# Patient Record
Sex: Female | Born: 1985 | Race: White | Hispanic: No | Marital: Single | Smoking: Never smoker
Health system: Southern US, Community
[De-identification: ages and names within clinical notes are randomized; demographics above are authoritative.]

---

## 2008-07-05 ENCOUNTER — Inpatient Hospital Stay: Payer: Self-pay | Admitting: Obstetrics & Gynecology

## 2010-01-24 IMAGING — CR DG CHEST 1V PORT
1 series · 1 of 1 positions shown · non-contrast
Comparison: none

REASON FOR EXAM: Postpartum Tachycardia
COMMENTS:

[view not recorded]
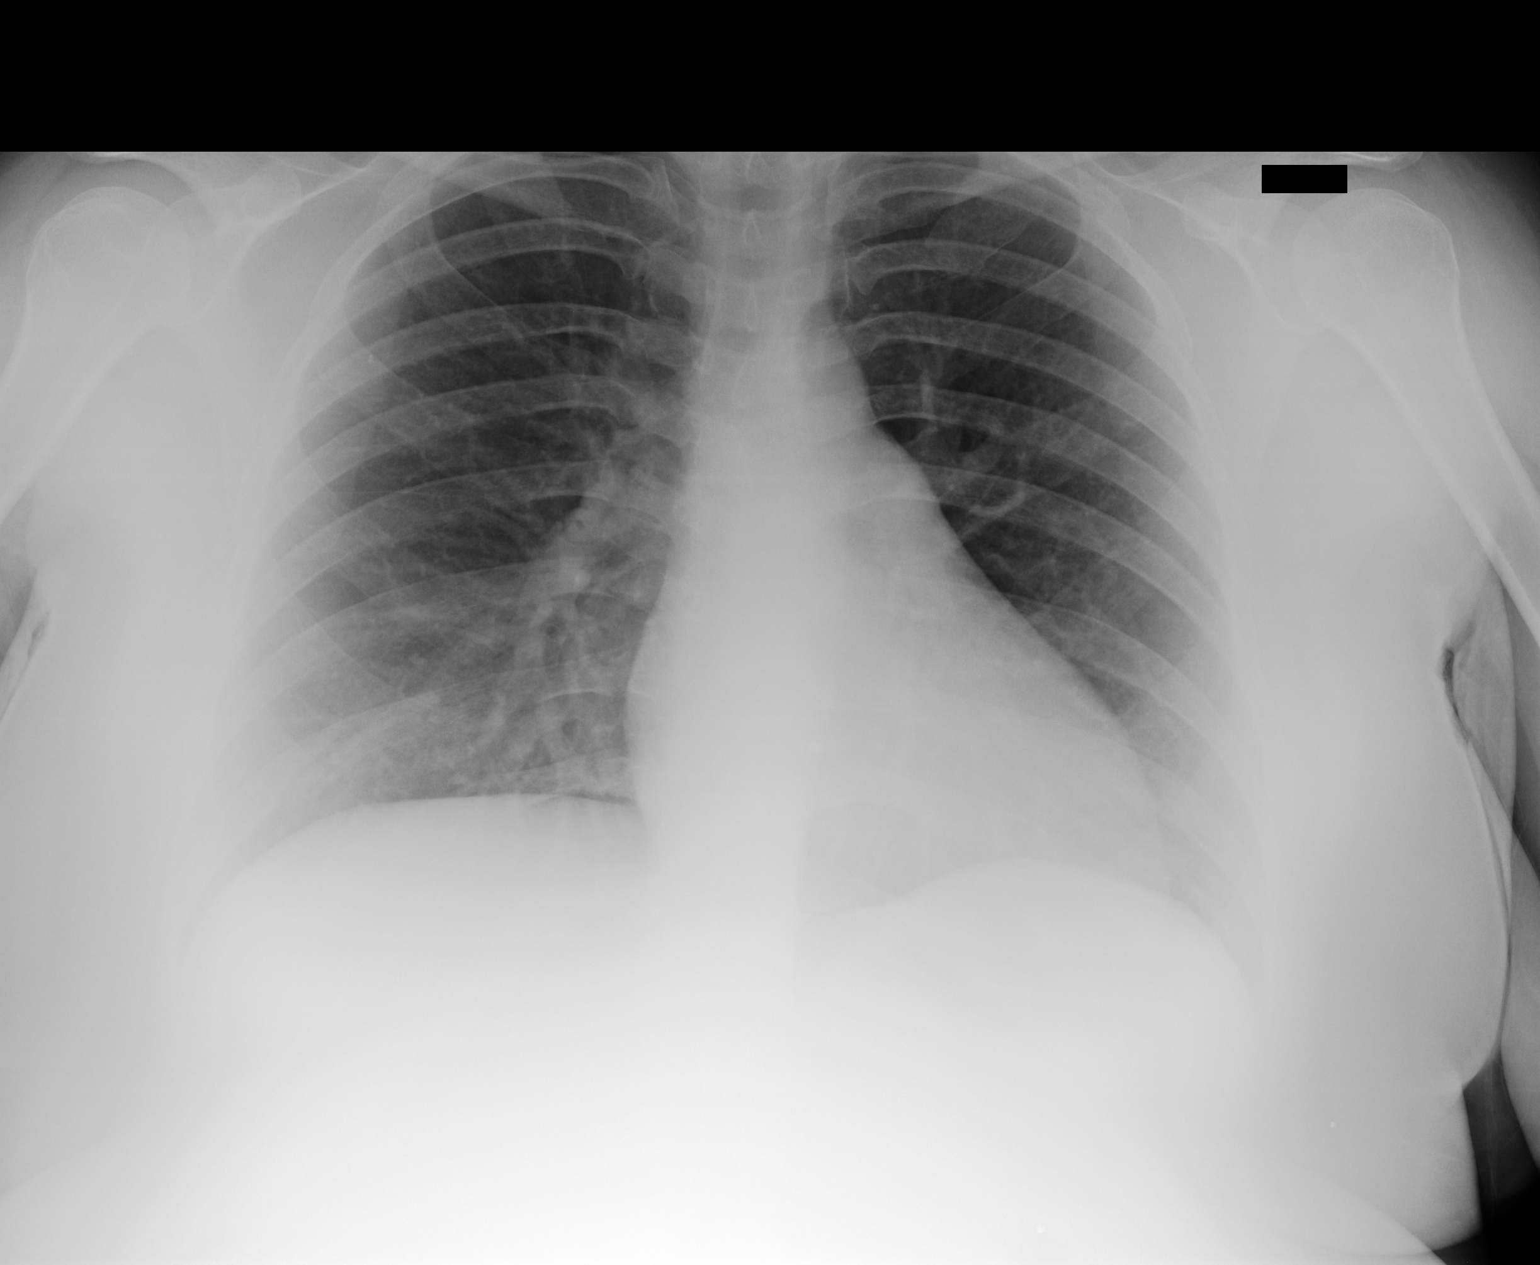

[1 of 1 positions shown; findings below may reference images not displayed]

PROCEDURE:     DXR - DXR PORTABLE CHEST SINGLE VIEW  - July 08, 2008  [DATE]

RESULT:     The lungs are adequately inflated. There is no discrete focal
infiltrate. There are coarse lung markings in the right infrahilar region.
The cardiac silhouette is normal in size. The pulmonary vascularity is not
engorged.
IMPRESSION: I do not see definite evidence of CHF. There are coarse lung markings in
the right infrahilar region which may reflect subsegmental atelectasis. A
followup PA and lateral chest x-ray would be of value when the patient can
tolerate the procedure.

## 2010-07-21 ENCOUNTER — Observation Stay: Payer: Self-pay | Admitting: Advanced Practice Midwife

## 2010-08-21 ENCOUNTER — Inpatient Hospital Stay: Payer: Self-pay

## 2019-11-11 ENCOUNTER — Other Ambulatory Visit: Payer: Self-pay

## 2019-11-11 ENCOUNTER — Encounter: Payer: Self-pay | Admitting: Obstetrics

## 2019-11-11 ENCOUNTER — Ambulatory Visit (INDEPENDENT_AMBULATORY_CARE_PROVIDER_SITE_OTHER): Payer: BC Managed Care – PPO

## 2019-11-11 ENCOUNTER — Other Ambulatory Visit: Payer: Self-pay | Admitting: Obstetrics

## 2019-11-11 ENCOUNTER — Ambulatory Visit (INDEPENDENT_AMBULATORY_CARE_PROVIDER_SITE_OTHER): Payer: BC Managed Care – PPO | Admitting: Obstetrics

## 2019-11-11 VITALS — BP 120/80 | Ht 67.0 in | Wt 314.0 lb

## 2019-11-11 DIAGNOSIS — N912 Amenorrhea, unspecified: Secondary | ICD-10-CM | POA: Diagnosis not present

## 2019-11-11 DIAGNOSIS — Z3491 Encounter for supervision of normal pregnancy, unspecified, first trimester: Secondary | ICD-10-CM

## 2019-11-11 DIAGNOSIS — Z3200 Encounter for pregnancy test, result unknown: Secondary | ICD-10-CM

## 2019-11-11 LAB — POCT URINE PREGNANCY: Preg Test, Ur: NEGATIVE

## 2019-11-11 NOTE — Progress Notes (Signed)
Obstetrics & Gynecology Office Visit   Chief Complaint:  Chief Complaint  Patient presents with  . Possible Pregnancy    History of Present Illness: Adrienne Lewis present to the office with a complaint of 3 + weeks of Nausea, and concern that she may be pregnant.she reports a regular period on 09/24/2019 that lasted 5 days, with a very short episode of bleeding on 10/22/2019. This recent episode of spotting lasted 2-3 days. Since that time, she has had mild nausea, and some breast tenderness.  She desires confirmation today.   Review of Systems:  ROS   Past Medical History:  History reviewed. No pertinent past medical history.  Past Surgical History:  History reviewed. No pertinent surgical history.  Gynecologic History: Patient's last menstrual period was 09/24/2019.  Obstetric History: [id number]  Family History:  History reviewed. No pertinent family history.  Social History:  Social History   Socioeconomic History  . Marital status: Single    Spouse name: Not on file  . Number of children: Not on file  . Years of education: Not on file  . Highest education level: Not on file  Occupational History  . Not on file  Tobacco Use  . Smoking status: Never Smoker  . Smokeless tobacco: Never Used  Vaping Use  . Vaping Use: Never used  Substance and Sexual Activity  . Alcohol use: Never  . Drug use: Never  . Sexual activity: Yes    Birth control/protection: None  Other Topics Concern  . Not on file  Social History Narrative  . Not on file   Social Determinants of Health   Financial Resource Strain:   . Difficulty of Paying Living Expenses:   Food Insecurity:   . Worried About Charity fundraiser in the Last Year:   . Arboriculturist in the Last Year:   Transportation Needs:   . Film/video editor (Medical):   Marland Kitchen Lack of Transportation (Non-Medical):   Physical Activity:   . Days of Exercise per Week:   . Minutes of Exercise per Session:   Stress:   . Feeling  of Stress :   Social Connections:   . Frequency of Communication with Friends and Family:   . Frequency of Social Gatherings with Friends and Family:   . Attends Religious Services:   . Active Member of Clubs or Organizations:   . Attends Archivist Meetings:   Marland Kitchen Marital Status:   Intimate Partner Violence:   . Fear of Current or Ex-Partner:   . Emotionally Abused:   Marland Kitchen Physically Abused:   . Sexually Abused:     Allergies:  No Known Allergies  Medications: Prior to Admission medications   Not on File    Physical Exam Vitals:  Vitals:   11/11/19 1025  BP: 120/80   Patient's last menstrual period was 09/24/2019.  Physical Exam  Pelvic only:  Entire  Mons area is shaved. No rashes, lesions or irritation noted. Normal vaginal rugae, noted. Scant white, nonmalodorous discharge seen. Uterus is anterior, retroverted and non enlarged. No adnexal masses noted.  Urine pregnancy test is NEGATIVE. Pelvic ultrasound reveals no intra/extrauterine pregnancy Endometrial stripe measures 12.55mm There is a 13 mm Nabothian cyst noted in the cervix   Assessment: 34 y.o. [id number]  Negative pregnancy test   Plan: Problem List Items Addressed This Visit    None    Visit Diagnoses    Possible pregnancy    -  Primary   Relevant Orders  POCT urine pregnancy (Completed)   Amenorrhea         We discussed the ultrasound results, and though this would represent an unplanned pregnancy if positive, [name] is tearful when she receives the ultrasound report. Discussed her starting on a daily multivitamin with folic acid. Also discussed her daily mild nausea, and that she may RTC in 2 weeks should she not start her next period (expected on 6/28).  Mirna Mires, CNM  11/11/2019 3:52 PM

## 2019-12-02 ENCOUNTER — Ambulatory Visit (INDEPENDENT_AMBULATORY_CARE_PROVIDER_SITE_OTHER): Payer: BC Managed Care – PPO | Admitting: Obstetrics

## 2019-12-02 ENCOUNTER — Other Ambulatory Visit (HOSPITAL_COMMUNITY)
Admission: RE | Admit: 2019-12-02 | Discharge: 2019-12-02 | Disposition: A | Discharge status: Home / Self Care | Payer: Self-pay | Source: Ambulatory Visit | Attending: Obstetrics | Admitting: Obstetrics

## 2019-12-02 ENCOUNTER — Other Ambulatory Visit: Payer: Self-pay

## 2019-12-02 VITALS — BP 122/74 | Ht 67.0 in | Wt 215.0 lb

## 2019-12-02 DIAGNOSIS — Z01419 Encounter for gynecological examination (general) (routine) without abnormal findings: Secondary | ICD-10-CM

## 2019-12-02 DIAGNOSIS — N898 Other specified noninflammatory disorders of vagina: Secondary | ICD-10-CM

## 2019-12-02 DIAGNOSIS — Z3009 Encounter for other general counseling and advice on contraception: Secondary | ICD-10-CM | POA: Diagnosis not present

## 2019-12-02 MED ORDER — NORETHINDRONE ACET-ETHINYL EST 1-20 MG-MCG PO TABS: 1.0000 | ORAL_TABLET | Freq: Every day | ORAL | 11 refills | Status: DC

## 2019-12-02 NOTE — Progress Notes (Signed)
Gynecology Annual Exam  PCP: Patient, No Pcp Per  Chief Complaint:  Chief Complaint  Patient presents with  . Annual Exam    History of Present Illness [name] is a 34 y.o. [id number] who LMP was No LMP recorded., presents today for her annual examination.  Her menses are present due to no hormonal BC use and regular cycles, hysterectomy, menopause, surgical menopause and hormonal contraceptive.  She does not have vasomotor sx. She uses no meds.  She is not sexually active. She does not have vaginal dryness.  Last Pap:she cannot remeber  Results were: no abnormalities /neg HPV DNA.  Hx of STDs: none   There is no FH of breast cancer. There is no FH of ovarian cancer. The patient does do self-breast exams.  Tobacco use: The patient denies current or previous tobacco use. Alcohol use: social drinker Exercise: not active  She does get adequate calcium and Vitamin D in her diet.   The patient is not currently sexually active. She currently uses None for contraception. The patient wears seatbelts: yes.   no recorded tests available  The patient has regular exercise: no.  The patient has ever been transfused or tattooed?: yes.  The patient reports that domestic violence in her life is absent.    Review of Systems: ROS   Past Medical History:  No past medical history on file.  Past Surgical History:  No past surgical history on file.  Medications: Prior to Admission medications   Medication Sig Start Date End Date Taking? Authorizing Provider  norethindrone-ethinyl estradiol (LOESTRIN 1/20, 21,) 1-20 MG-MCG tablet Take 1 tablet by mouth daily. 12/02/19   Mirna Mires, CNM    Allergies:  No Known Allergies  Gynecologic History: No LMP recorded.  Obstetric History: [id number]  Social History:  Social History   Socioeconomic History  . Marital status: Single    Spouse name: Not on file  . Number of children: Not on file  . Years of education: Not on file  .  Highest education level: Not on file  Occupational History  . Not on file  Tobacco Use  . Smoking status: Never Smoker  . Smokeless tobacco: Never Used  Vaping Use  . Vaping Use: Never used  Substance and Sexual Activity  . Alcohol use: Never  . Drug use: Never  . Sexual activity: Yes    Birth control/protection: None  Other Topics Concern  . Not on file  Social History Narrative  . Not on file   Social Determinants of Health   Financial Resource Strain:   . Difficulty of Paying Living Expenses:   Food Insecurity:   . Worried About Programme researcher, broadcasting/film/video in the Last Year:   . Barista in the Last Year:   Transportation Needs:   . Freight forwarder (Medical):   Marland Kitchen Lack of Transportation (Non-Medical):   Physical Activity:   . Days of Exercise per Week:   . Minutes of Exercise per Session:   Stress:   . Feeling of Stress :   Social Connections:   . Frequency of Communication with Friends and Family:   . Frequency of Social Gatherings with Friends and Family:   . Attends Religious Services:   . Active Member of Clubs or Organizations:   . Attends Banker Meetings:   Marland Kitchen Marital Status:   Intimate Partner Violence:   . Fear of Current or Ex-Partner:   . Emotionally Abused:   .  Physically Abused:   . Sexually Abused:     Family History:  No family history on file.   Physical Exam Vitals:  Vitals:   12/02/19 1534  BP: 122/74   Physical Exam   Assessment: 34 y.o. [id number] well woman exam  Plan:  1) Contraception Education given regarding options for contraception, including oral contraceptives.  2) STI screening was offered declined, except for Nuswab for vaginal discharge  3) Pap done  4) Routine healthcare maintenance including cholesterol, diabetes screening declined  5) Follow up 1 year for routine annual exam  1. Women's annual routine gynecological examination  - Cytology - PAP - NuSwab Vaginitis (VG) -  norethindrone-ethinyl estradiol (LOESTRIN 1/20, 21,) 1-20 MG-MCG tablet; Take 1 tablet by mouth daily.  Dispense: 28 tablet; Refill: 11  2. Contraceptive use education  - norethindrone-ethinyl estradiol (LOESTRIN 1/20, 21,) 1-20 MG-MCG tablet; Take 1 tablet by mouth daily.  Dispense: 28 tablet; Refill: 11  3. Vaginal discharge  - NuSwab Vaginitis (VG)    Mirna Mires, CNM  12/02/2019 5:18 PM

## 2019-12-06 ENCOUNTER — Other Ambulatory Visit: Payer: Self-pay | Admitting: Obstetrics

## 2019-12-06 ENCOUNTER — Encounter: Payer: Self-pay | Admitting: Obstetrics

## 2019-12-06 DIAGNOSIS — B9689 Other specified bacterial agents as the cause of diseases classified elsewhere: Secondary | ICD-10-CM

## 2019-12-06 DIAGNOSIS — N898 Other specified noninflammatory disorders of vagina: Secondary | ICD-10-CM

## 2019-12-06 LAB — NUSWAB VAGINITIS (VG)
Atopobium vaginae: HIGH Score — AB
BVAB 2: HIGH Score — AB
Candida albicans, NAA: NEGATIVE
Candida glabrata, NAA: NEGATIVE
Megasphaera 1: HIGH Score — AB
Trich vag by NAA: NEGATIVE

## 2019-12-06 MED ORDER — METRONIDAZOLE 500 MG PO TABS: 500.0000 mg | ORAL_TABLET | Freq: Two times a day (BID) | ORAL | 0 refills | Status: AC

## 2019-12-06 NOTE — Progress Notes (Signed)
Nu swab results reveal Bacterial vaginosis. A Prescription for flagyl is sent to her pharmacy and the patient is contacted by phone and notified.  Mirna Mires, CNM  12/06/2019 6:06 PM

## 2019-12-07 LAB — CYTOLOGY - PAP
Comment: NEGATIVE
Diagnosis: NEGATIVE
High risk HPV: NEGATIVE

## 2019-12-08 ENCOUNTER — Encounter: Payer: Self-pay | Admitting: Obstetrics

## 2019-12-08 NOTE — Progress Notes (Signed)
Patient notified by phone of her pap smear results, and about her +BV. She will pick up her prescription for Flagyl later this week. Mirna Mires, CNM  12/08/2019 10:40 AM

## 2023-04-10 ENCOUNTER — Emergency Department: Payer: BC Managed Care – PPO

## 2023-04-10 ENCOUNTER — Emergency Department
Admission: EM | Admit: 2023-04-10 | Discharge: 2023-04-10 | Disposition: A | Discharge status: Home / Self Care | Payer: BC Managed Care – PPO | Attending: Emergency Medicine | Admitting: Emergency Medicine

## 2023-04-10 ENCOUNTER — Encounter: Payer: Self-pay | Admitting: Radiology

## 2023-04-10 DIAGNOSIS — R0602 Shortness of breath: Secondary | ICD-10-CM | POA: Diagnosis not present

## 2023-04-10 DIAGNOSIS — R079 Chest pain, unspecified: Secondary | ICD-10-CM | POA: Diagnosis present

## 2023-04-10 DIAGNOSIS — R0781 Pleurodynia: Secondary | ICD-10-CM | POA: Diagnosis not present

## 2023-04-10 DIAGNOSIS — M546 Pain in thoracic spine: Secondary | ICD-10-CM

## 2023-04-10 LAB — BASIC METABOLIC PANEL
Anion gap: 7 (ref 5–15)
BUN: 11 mg/dL (ref 6–20)
CO2: 25 mmol/L (ref 22–32)
Calcium: 9 mg/dL (ref 8.9–10.3)
Chloride: 104 mmol/L (ref 98–111)
Creatinine, Ser: 0.85 mg/dL (ref 0.44–1.00)
GFR, Estimated: >60 mL/min (ref >60)
Glucose, Bld: 107 mg/dL — ABNORMAL HIGH (ref 70–99)
Potassium: 3.8 mmol/L (ref 3.5–5.1)
Sodium: 136 mmol/L (ref 135–145)

## 2023-04-10 LAB — HEPATIC FUNCTION PANEL
ALT: 19 U/L (ref 0–44)
AST: 20 U/L (ref 15–41)
Albumin: 4.2 g/dL (ref 3.5–5.0)
Alkaline Phosphatase: 69 U/L (ref 38–126)
Bilirubin, Direct: 0.2 mg/dL (ref 0.0–0.2)
Indirect Bilirubin: 0.5 mg/dL (ref 0.3–0.9)
Total Bilirubin: 0.7 mg/dL (ref <1.2)
Total Protein: 8.4 g/dL — ABNORMAL HIGH (ref 6.5–8.1)

## 2023-04-10 LAB — TROPONIN I (HIGH SENSITIVITY)
Troponin I (High Sensitivity): 2 ng/L (ref <18)
Troponin I (High Sensitivity): 2 ng/L (ref <18)

## 2023-04-10 LAB — CBC
HCT: 43 % (ref 36.0–46.0)
Hemoglobin: 14 g/dL (ref 12.0–15.0)
MCH: 30.2 pg (ref 26.0–34.0)
MCHC: 32.6 g/dL (ref 30.0–36.0)
MCV: 92.9 fL (ref 80.0–100.0)
Platelets: 381 10*3/uL (ref 150–400)
RBC: 4.63 MIL/uL (ref 3.87–5.11)
RDW: 12.2 % (ref 11.5–15.5)
WBC: 11.5 10*3/uL — ABNORMAL HIGH (ref 4.0–10.5)
nRBC: 0 % (ref 0.0–0.2)

## 2023-04-10 LAB — HCG, QUANTITATIVE, PREGNANCY: hCG, Beta Chain, Quant, S: <1 m[IU]/mL (ref <5)

## 2023-04-10 LAB — LIPASE, BLOOD: Lipase: 35 U/L (ref 11–51)

## 2023-04-10 MED ORDER — KETOROLAC TROMETHAMINE 10 MG PO TABS: 10.0000 mg | ORAL_TABLET | Freq: Four times a day (QID) | ORAL | 0 refills | Status: DC | PRN

## 2023-04-10 MED ORDER — KETOROLAC TROMETHAMINE 15 MG/ML IJ SOLN
15.0000 mg | Freq: Once | INTRAMUSCULAR | Status: AC
  Administered 2023-04-10: 15 mg via INTRAVENOUS
  Filled 2023-04-10: qty 1

## 2023-04-10 MED ORDER — SODIUM CHLORIDE 0.9 % IV BOLUS
1000.0000 mL | Freq: Once | INTRAVENOUS | Status: AC
  Administered 2023-04-10: 1000 mL via INTRAVENOUS

## 2023-04-10 MED ORDER — CYCLOBENZAPRINE HCL 5 MG PO TABS: 5.0000 mg | ORAL_TABLET | Freq: Three times a day (TID) | ORAL | 0 refills | Status: AC | PRN

## 2023-04-10 MED ORDER — IOHEXOL 350 MG/ML SOLN
100.0000 mL | Freq: Once | INTRAVENOUS | Status: AC | PRN
  Administered 2023-04-10: 100 mL via INTRAVENOUS

## 2023-04-10 NOTE — ED Provider Notes (Signed)
[facility] Provider Note    Event Date/Time   First MD Initiated Contact with Patient 04/10/23 1132     (approximate)   History   Chief Complaint: Back Pain and Chest Pain   HPI  Adrienne Lewis is a 37 y.o. female with no significant past medical history who comes ED complaining of right posterior thoracic pain that started yesterday morning all of a sudden.  Nonradiating.  Worse with deep breathing.  Associated with shortness of breath, feels sharp.  Constant and worsening since yesterday.  No fever or cough.  Not worse with movement.  No lower extremity edema.  Denies travel trauma hospitalization surgery or history of DVT/PE.  No recent injuries.  Takes no medications.          Physical Exam   Triage Vital Signs: ED Triage Vitals  Encounter Vitals Group     BP 04/10/23 1118 (!) 158/88     Systolic BP Percentile --      Diastolic BP Percentile --      Pulse Rate 04/10/23 1118 (!) 139     Resp 04/10/23 1118 20     Temp 04/10/23 1118 98.1 F (36.7 C)     Temp Source 04/10/23 1118 Oral     SpO2 04/10/23 1118 95 %     Weight 04/10/23 1115 234 lb (106.1 kg)     Height 04/10/23 1115 5\' 5"  (1.651 m)     Head Circumference --      Peak Flow --      Pain Score 04/10/23 1115 7     Pain Loc --      Pain Education --      Exclude from Growth Chart --     Most recent vital signs: Vitals:   04/10/23 1449 04/10/23 1459  BP:  (!) 129/95  Pulse: 98 (!) 106  Resp: (!) 21 17  Temp:  98.5 F (36.9 C)  SpO2:  100%    General: Awake, no distress.  CV:  Good peripheral perfusion.  Tachycardia heart rate 140.  Symmetric distal pulses Resp:  Normal effort.  Clear to auscultation bilaterally Abd:  No distention.  Soft nontender Other:  No midline spinal tenderness.  On palpation patient localizes pain to right paraspinous lumbar back, not reproducible.   ED Results / Procedures / Treatments   Labs (all labs ordered are listed, but only  abnormal results are displayed) Labs Reviewed  BASIC METABOLIC PANEL - Abnormal; Notable for the following components:      Result Value   Glucose, Bld 107 (*)    All other components within normal limits  CBC - Abnormal; Notable for the following components:   WBC 11.5 (*)    All other components within normal limits  HEPATIC FUNCTION PANEL - Abnormal; Notable for the following components:   Total Protein 8.4 (*)    All other components within normal limits  LIPASE, BLOOD  HCG, QUANTITATIVE, PREGNANCY  URINALYSIS, W/ REFLEX TO CULTURE (INFECTION SUSPECTED)  POC URINE PREG, ED  TROPONIN I (HIGH SENSITIVITY)  TROPONIN I (HIGH SENSITIVITY)     EKG Interpreted by me Sinus tachycardia rate 134.  Normal axis and intervals.  Inferior Q waves in lead III.  Normal ST segments and T waves.  No ischemic changes.  There is an S1Q3T3 pattern.   RADIOLOGY Chest x-ray interpreted by me, negative for pneumothorax or infiltrate.  Radiology report reviewed   PROCEDURES:  Procedures   MEDICATIONS ORDERED IN  ED: Medications  sodium chloride 0.9 % bolus 1,000 mL (1,000 mLs Intravenous New Bag/Given 04/10/23 1140)  ketorolac (TORADOL) 15 MG/ML injection 15 mg (15 mg Intravenous Given 04/10/23 1140)  iohexol (OMNIPAQUE) 350 MG/ML injection 100 mL (100 mLs Intravenous Contrast Given 04/10/23 1342)     IMPRESSION / MDM / ASSESSMENT AND PLAN / ED COURSE  I reviewed the triage vital signs and the nursing notes.  DDx: Pneumothorax, pneumonia, PE, non-STEMI, ureterolithiasis, pregnancy  Patient's presentation is most consistent with acute presentation with potential threat to life or bodily function.  Patient presents with rapid onset of right posterior thoracic pain.  She also indicates the location of pain being lower into the lumbar region.  Not recent boosted upon exam, doubt musculoskeletal origin.  No signs of infection.  Highest suspicion for pneumothorax, PE, ureterolithiasis, or  complicated pregnancy.  Will give IV fluids, Toradol, check labs and CT.   Clinical Course as of 04/10/23 1513  Thu Apr 10, 2023  1313 HCG, Beta Chain, Quant, S: <1 Preg neg. Initial labs normal. Will f/u CT.  [PS]    Clinical Course User Index [PS] Sharman Cheek, MD     FINAL CLINICAL IMPRESSION(S) / ED DIAGNOSES   Final diagnoses:  Pleuritic chest pain     Rx / DC Orders   ED Discharge Orders     None        Note:  This document was prepared using Dragon voice recognition software and may include unintentional dictation errors.   Sharman Cheek, MD 04/10/23 [phone]

## 2023-04-10 NOTE — ED Triage Notes (Signed)
Pt referred to ED from [facility]. States that yesterday she woke up having R thoracic back pain. Also c/o chest tightness and SOB for several days. HR in triage 137. No hx of afib. Pt denies recent long travel, birth control, hx of blood clots, smoking, etc.

## 2023-04-10 NOTE — ED Notes (Signed)
This RN called CCMD to verify patient is being monitored. Patient monitoring confirmed.

## 2023-04-10 NOTE — ED Provider Notes (Signed)
Emergency department handoff note  Care of this patient was signed out to me at the end of the previous provider shift.  All pertinent patient information was conveyed and all questions were answered.  Patient pending CTA of the chest as well as CT of the abdomen and pelvis that did not show any evidence of acute abnormalities.  I am concerned the patient has likely musculoskeletal back pain and will treat appropriately with NSAIDs and muscle relaxants.  The patient has been reexamined and is ready to be discharged.  All diagnostic results have been reviewed and discussed with the patient/family.  Care plan has been outlined and the patient/family understands all current diagnoses, results, and treatment plans.  There are no new complaints, changes, or physical findings at this time.  All questions have been addressed and answered.  All medications, if any, that were given while in the emergency department or any that are being prescribed have been reviewed with the patient/family.  All side effects and adverse reactions have been explained.  Patient was instructed to, and agrees to follow-up with their primary care physician as well as return to the emergency department if any new or worsening symptoms develop.   Merwyn Katos, MD 04/10/23 [phone]

## 2023-04-10 NOTE — ED Notes (Signed)
Patient transported to CT 

## 2023-04-23 ENCOUNTER — Other Ambulatory Visit: Payer: Self-pay

## 2023-04-23 ENCOUNTER — Emergency Department
Admission: EM | Admit: 2023-04-23 | Discharge: 2023-04-23 | Disposition: A | Discharge status: Home / Self Care | Payer: BC Managed Care – PPO | Attending: Emergency Medicine | Admitting: Emergency Medicine

## 2023-04-23 DIAGNOSIS — M62830 Muscle spasm of back: Secondary | ICD-10-CM | POA: Diagnosis not present

## 2023-04-23 DIAGNOSIS — M545 Low back pain, unspecified: Secondary | ICD-10-CM | POA: Diagnosis present

## 2023-04-23 LAB — BASIC METABOLIC PANEL
Anion gap: 10 (ref 5–15)
BUN: 12 mg/dL (ref 6–20)
CO2: 22 mmol/L (ref 22–32)
Calcium: 9 mg/dL (ref 8.9–10.3)
Chloride: 105 mmol/L (ref 98–111)
Creatinine, Ser: 0.84 mg/dL (ref 0.44–1.00)
GFR, Estimated: >60 mL/min (ref >60)
Glucose, Bld: 103 mg/dL — ABNORMAL HIGH (ref 70–99)
Potassium: 3.6 mmol/L (ref 3.5–5.1)
Sodium: 137 mmol/L (ref 135–145)

## 2023-04-23 LAB — CBC
HCT: 41.4 % (ref 36.0–46.0)
Hemoglobin: 13.6 g/dL (ref 12.0–15.0)
MCH: 30.1 pg (ref 26.0–34.0)
MCHC: 32.9 g/dL (ref 30.0–36.0)
MCV: 91.6 fL (ref 80.0–100.0)
Platelets: 388 10*3/uL (ref 150–400)
RBC: 4.52 MIL/uL (ref 3.87–5.11)
RDW: 11.9 % (ref 11.5–15.5)
WBC: 9.2 10*3/uL (ref 4.0–10.5)
nRBC: 0 % (ref 0.0–0.2)

## 2023-04-23 MED ORDER — KETOROLAC TROMETHAMINE 30 MG/ML IJ SOLN
30.0000 mg | Freq: Once | INTRAMUSCULAR | Status: AC
  Administered 2023-04-23: 30 mg via INTRAMUSCULAR
  Filled 2023-04-23: qty 1

## 2023-04-23 MED ORDER — TRAMADOL HCL 50 MG PO TABS: 50.0000 mg | ORAL_TABLET | Freq: Four times a day (QID) | ORAL | 0 refills | Status: DC | PRN

## 2023-04-23 MED ORDER — METHOCARBAMOL 500 MG PO TABS: 500.0000 mg | ORAL_TABLET | Freq: Three times a day (TID) | ORAL | 1 refills | Status: DC | PRN

## 2023-04-23 MED ORDER — METHOCARBAMOL 500 MG PO TABS
500.0000 mg | ORAL_TABLET | Freq: Once | ORAL | Status: AC
  Administered 2023-04-23: 500 mg via ORAL
  Filled 2023-04-23: qty 1

## 2023-04-23 MED ORDER — HYDROCODONE-ACETAMINOPHEN 5-325 MG PO TABS
1.0000 | ORAL_TABLET | Freq: Once | ORAL | Status: AC
  Administered 2023-04-23: 1 via ORAL
  Filled 2023-04-23: qty 1

## 2023-04-23 NOTE — ED Provider Notes (Signed)
[facility] Provider Note    Event Date/Time   First MD Initiated Contact with Patient 04/23/23 1250     (approximate)   History   Back Pain   HPI  Adrienne Lewis is a 37 y.o. female who presents with complaints of low back pain.  Patient reports sent by doctor for evaluation because of elevated heart rate.  Notably patient seen here on the 14th of this month, had CT abdomen pelvis and CT angiography of the chest for similar discomfort, eventually was discharged with likely musculoskeletal back pain.  Patient reports overall she feels well, last night she felt significant tension all up and down her spine which made her anxious.  She denies fevers or chills.  No neurodeficits.  Normal strength in the lower extremities.     Physical Exam   Triage Vital Signs: ED Triage Vitals  Encounter Vitals Group     BP 04/23/23 1212 (!) 168/108     Systolic BP Percentile --      Diastolic BP Percentile --      Pulse Rate 04/23/23 1212 (!) 136     Resp 04/23/23 1212 18     Temp 04/23/23 1212 98 F (36.7 C)     Temp Source 04/23/23 1413 Oral     SpO2 04/23/23 1212 95 %     Weight 04/23/23 1212 105.7 kg (233 lb)     Height 04/23/23 1212 1.651 m (5\' 5" )     Head Circumference --      Peak Flow --      Pain Score 04/23/23 1212 7     Pain Loc --      Pain Education --      Exclude from Growth Chart --     Most recent vital signs: Vitals:   04/23/23 1212 04/23/23 1413  BP: (!) 168/108 123/78  Pulse: (!) 136 (!) 108  Resp: 18 18  Temp: 98 F (36.7 C) (!) 97.5 F (36.4 C)  SpO2: 95% 95%     General: Awake, no distress.  CV:  Good peripheral perfusion.  Resp:  Normal effort.  Abd:  No distention.  Other:  Back: No rash, mild paraspinal lumbar tenderness, no vertebral tenderness palpation, normal ambulation, normal strength in lower extremities, no saddle anesthesia   ED Results / Procedures / Treatments   Labs (all labs ordered are listed, but  only abnormal results are displayed) Labs Reviewed  BASIC METABOLIC PANEL - Abnormal; Notable for the following components:      Result Value   Glucose, Bld 103 (*)    All other components within normal limits  CBC     EKG     RADIOLOGY     PROCEDURES:  Critical Care performed:   Procedures   MEDICATIONS ORDERED IN ED: Medications  ketorolac (TORADOL) 30 MG/ML injection 30 mg (30 mg Intramuscular Given 04/23/23 1337)  methocarbamol (ROBAXIN) tablet 500 mg (500 mg Oral Given 04/23/23 1348)  HYDROcodone-acetaminophen (NORCO/VICODIN) 5-325 MG per tablet 1 tablet (1 tablet Oral Given 04/23/23 1336)     IMPRESSION / MDM / ASSESSMENT AND PLAN / ED COURSE  I reviewed the triage vital signs and the nursing notes. Patient's presentation is most consistent with acute illness / injury with system symptoms.  Patient presents with back pain as above, she is markedly tachycardic initially however this improved rapidly with treatment, suspect component of anxiety.  She was treated with Toradol and Robaxin and Vicodin with rapid improvement.  After treatment she feels much better and would like to be discharged, no further workup necessary at this time, outpatient follow-up as needed        FINAL CLINICAL IMPRESSION(S) / ED DIAGNOSES   Final diagnoses:  Muscle spasm of back     Rx / DC Orders   ED Discharge Orders          Ordered    traMADol (ULTRAM) 50 MG tablet  Every 6 hours PRN        04/23/23 1426    methocarbamol (ROBAXIN) 500 MG tablet  Every 8 hours PRN        04/23/23 1426             Note:  This document was prepared using Dragon voice recognition software and may include unintentional dictation errors.   Jene Every, MD 04/23/23 [phone]

## 2023-04-23 NOTE — ED Triage Notes (Signed)
Pt to ED for continued back pain for a few weeks. Denies n/v, chest pain, urinary sx. Reports increased pain with movement and inspiration Sent from [facility] for tachycardia

## 2023-06-27 ENCOUNTER — Ambulatory Visit: Payer: Self-pay | Admitting: Internal Medicine

## 2023-09-10 ENCOUNTER — Encounter: Payer: Self-pay | Admitting: Internal Medicine

## 2023-09-10 ENCOUNTER — Ambulatory Visit: Payer: Self-pay | Admitting: Internal Medicine

## 2023-09-10 ENCOUNTER — Other Ambulatory Visit: Payer: Self-pay

## 2023-09-10 VITALS — BP 120/70 | HR 119 | Temp 98.1°F | Resp 16 | Ht 65.0 in | Wt 182.6 lb

## 2023-09-10 DIAGNOSIS — R000 Tachycardia, unspecified: Secondary | ICD-10-CM

## 2023-09-10 DIAGNOSIS — H6993 Unspecified Eustachian tube disorder, bilateral: Secondary | ICD-10-CM

## 2023-09-10 DIAGNOSIS — R635 Abnormal weight gain: Secondary | ICD-10-CM

## 2023-09-10 DIAGNOSIS — E669 Obesity, unspecified: Secondary | ICD-10-CM | POA: Diagnosis not present

## 2023-09-10 DIAGNOSIS — Z23 Encounter for immunization: Secondary | ICD-10-CM | POA: Diagnosis not present

## 2023-09-10 DIAGNOSIS — Z13220 Encounter for screening for lipoid disorders: Secondary | ICD-10-CM

## 2023-09-10 NOTE — Progress Notes (Signed)
 New Patient Office Visit  Subjective    Patient ID: Adrienne Lewis, female    DOB: 1986-03-02  Age: 38 y.o. MRN: [id number]  CC:  Chief Complaint  Patient presents with   Establish Care   Sinusitis    HPI Adrienne Lewis presents to establish care. She has no chronic medical conditions and does not take regular medications. Did have an incident involving stress, chest pressure and tachycardia that resulted in an ER visit in November. Labs reviewed, no abnormalities, EKG sinus tachycardia and CTA negative. Also would like to discuss weight loss.   Discussed the use of AI scribe software for clinical note transcription with the patient, who gave verbal consent to proceed.  History of Present Illness The patient, with no chronic medical conditions, presents for establishing care. She has a history of a broken ankle in 2018, which was her first ever broken bone. In November, the patient had two ER visits due to back pain on the right side, chest tightness, and shortness of breath. Initially, these symptoms were suspected to be heart-related, leading to an EKG that showed sinus tachycardia. However, a CT scan ruled out a blood clot, and the symptoms were later diagnosed as muscle spasms and costochondritis. The patient was given an injection and muscle relaxers, which have since resolved the issue. Currently, the patient reports a blocked right ear and sinus issues, which she believes to be allergies. She also expresses a desire to lose weight and discuss her previous experience with phentermine, which helped her lose 70 pounds.  Health Maintenance: -Blood work due -Pap 2021, due at the end of the year -Tdap due  Outpatient Encounter Medications as of 09/10/2023  Medication Sig   ketorolac (TORADOL) 10 MG tablet Take 1 tablet (10 mg total) by mouth every 6 (six) hours as needed. (Patient not taking: Reported on 09/10/2023)   methocarbamol (ROBAXIN) 500 MG tablet Take 1 tablet (500 mg  total) by mouth every 8 (eight) hours as needed for muscle spasms. (Patient not taking: Reported on 09/10/2023)   norethindrone-ethinyl estradiol (LOESTRIN 1/20, 21,) 1-20 MG-MCG tablet Take 1 tablet by mouth daily. (Patient not taking: Reported on 09/10/2023)   traMADol (ULTRAM) 50 MG tablet Take 1 tablet (50 mg total) by mouth every 6 (six) hours as needed. (Patient not taking: Reported on 09/10/2023)   No facility-administered encounter medications on file as of 09/10/2023.    No past medical history on file.  No past surgical history on file.  Family History  Problem Relation Age of Onset   Hypertension Mother    Thyroid disease Mother    Parkinson's disease Mother    Hypertension Father    Thyroid disease Father     Social History   Socioeconomic History   Marital status: Single    Spouse name: Not on file   Number of children: 2   Years of education: Not on file   Highest education level: Some college, no degree  Occupational History   Not on file  Tobacco Use   Smoking status: Never    Passive exposure: Never   Smokeless tobacco: Never  Vaping Use   Vaping status: Never Used  Substance and Sexual Activity   Alcohol use: Yes   Drug use: Never   Sexual activity: Yes    Birth control/protection: None  Other Topics Concern   Not on file  Social History Narrative   Not on file   Social Drivers of Corporate investment banker  Strain: Low Risk  (06/25/2023)   Overall Financial Resource Strain (CARDIA)    Difficulty of Paying Living Expenses: Not very hard  Food Insecurity: No Food Insecurity (06/25/2023)   Hunger Vital Sign    Worried About Running Out of Food in the Last Year: Never true    Ran Out of Food in the Last Year: Never true  Transportation Needs: No Transportation Needs (06/25/2023)   PRAPARE - Administrator, Civil Service (Medical): No    Lack of Transportation (Non-Medical): No  Physical Activity: Insufficiently Active (06/25/2023)    Exercise Vital Sign    Days of Exercise per Week: 3 days    Minutes of Exercise per Session: 20 min  Stress: No Stress Concern Present (06/25/2023)   [employer] of Occupational Health - Occupational Stress Questionnaire    Feeling of Stress : Not at all  Social Connections: Moderately Isolated (06/25/2023)   Social Connection and Isolation Panel [NHANES]    Frequency of Communication with Friends and Family: More than three times a week    Frequency of Social Gatherings with Friends and Family: More than three times a week    Attends Religious Services: 1 to 4 times per year    Active Member of [employer] or Organizations: No    Attends Engineer, structural: Not on file    Marital Status: Divorced  Intimate Partner Violence: Not on file    Review of Systems  Constitutional:  Negative for chills and fever.  HENT:  Positive for congestion and ear pain.   Respiratory:  Negative for shortness of breath.   Cardiovascular:  Negative for chest pain and palpitations.        Objective    BP 120/70 (Cuff Size: Large)   Pulse (!) 134   Temp 98.1 F (36.7 C) (Oral)   Resp 16   Ht 5\' 5"  (1.651 m)   Wt 182 lb 9.6 oz (82.8 kg)   LMP 09/04/2023   SpO2 98%   BMI 30.39 kg/m   Physical Exam Constitutional:      Appearance: Normal appearance.  HENT:     Head: Normocephalic and atraumatic.     Right Ear: Ear canal and external ear normal.     Left Ear: Ear canal and external ear normal.     Ears:     Comments: Bilateral eustachian tube dysfunction    Nose: Congestion present.     Mouth/Throat:     Mouth: Mucous membranes are moist.     Pharynx: Oropharynx is clear.  Eyes:     Conjunctiva/sclera: Conjunctivae normal.  Cardiovascular:     Rate and Rhythm: Regular rhythm. Tachycardia present.  Pulmonary:     Effort: Pulmonary effort is normal.     Breath sounds: Normal breath sounds.  Skin:    General: Skin is warm and dry.  Neurological:     General: No focal deficit  present.     Mental Status: She is alert. Mental status is at baseline.  Psychiatric:        Mood and Affect: Mood normal.        Behavior: Behavior normal.         Assessment & Plan:   Assessment & Plan Hx of Costochondritis Diagnosed with costochondritis after ruling out pulmonary embolism in November. Symptoms improved with muscle relaxants. Residual rib pain likely musculoskeletal strain. - Recommend scheduled ibuprofen for inflammation if symptoms recur. - Consider muscle relaxants if symptoms recur. - Advise follow-up if  symptoms persist or worsen.  Sinus Tachycardia Episodes likely stress and pain-related. Heart rate slightly elevated but normal rhythm. Discontinued energy drinks. Discussed genetic factors and thyroid dysfunction. - Order thyroid function tests to rule out thyroid-related causes. - Consider Zio monitor for home heart rhythm monitoring if symptoms persist.  Eustachian Tube Dysfunction Right ear congestion and sinus pressure likely due to allergies. Symptoms consistent with Eustachian tube dysfunction. - Recommend over-the-counter antihistamines such as Claritin, Allegra, or Zyrtec. - Recommend nasal steroid spray such as Flonase.  Weight Management Discussed risks of phentermine and recommended GLP-1 receptor agonists for weight loss. Explained mechanism, side effects, and lifestyle changes. Discussed insurance coverage and cost. - Order A1c test to assess for prediabetes. - Order cholesterol test. - Discuss potential prescription of Wegovy or Zepbound based on insurance coverage and lab results. - Encourage lifestyle changes including diet and exercise.  General Health Maintenance Due for routine health maintenance screenings. Last Pap smear in 2021 was negative. Guidelines changed to every three to five years. - Plan for Pap smear at the end of the year or next year during a physical exam.  - TSH - HgB A1c - Lipid Profile - Tdap vaccine greater  than or equal to 7yo IM   Return in about 6 months (around 03/11/2024) for physical w/Pap.   Rockney Cid, DO

## 2023-09-10 NOTE — Patient Instructions (Addendum)
 It was great seeing you today!  Plan discussed at today's visit: -Blood work ordered today, results will be uploaded to MyChart. Please return fasting, lab is open Monday-Friday 8-11:30 and 1:30-3:30.  -If labs normal I will prescribe weight loss medication and we will see what insurance says - if you get the medication please call and scheduled in 3 weeks  Follow up in: 6 months   Take care and let us  know if you have any questions or concerns prior to your next visit.  Dr. Bud Care

## 2023-09-13 LAB — LIPID PANEL
Cholesterol: 133 mg/dL (ref <200)
HDL: 45 mg/dL — ABNORMAL LOW (ref >=50)
LDL Cholesterol (Calc): 76 mg/dL
Non-HDL Cholesterol (Calc): 88 mg/dL (ref <130)
Total CHOL/HDL Ratio: 3 (calc) (ref <5.0)
Triglycerides: 42 mg/dL (ref <150)

## 2023-09-13 LAB — HEMOGLOBIN A1C
Hgb A1c MFr Bld: 5.6 % (ref <5.7)
Mean Plasma Glucose: 114 mg/dL
eAG (mmol/L): 6.3 mmol/L

## 2023-09-13 LAB — TSH: TSH: 1.98 m[IU]/L

## 2023-09-15 ENCOUNTER — Encounter: Payer: Self-pay | Admitting: Internal Medicine

## 2023-09-15 MED ORDER — TIRZEPATIDE-WEIGHT MANAGEMENT 2.5 MG/0.5ML ~~LOC~~ SOLN: 2.5000 mg | SUBCUTANEOUS | 0 refills | Status: DC

## 2023-09-15 MED ORDER — TIRZEPATIDE-WEIGHT MANAGEMENT 2.5 MG/0.5ML ~~LOC~~ SOAJ: 2.5000 mg | SUBCUTANEOUS | 0 refills | Status: DC

## 2023-09-15 NOTE — Addendum Note (Signed)
 Addended by: Rockney Cid on: 09/15/2023 12:58 PM   Modules accepted: Orders

## 2023-09-23 ENCOUNTER — Encounter: Payer: Self-pay | Admitting: Internal Medicine

## 2023-09-26 ENCOUNTER — Other Ambulatory Visit: Payer: Self-pay | Admitting: Internal Medicine

## 2023-09-26 DIAGNOSIS — E669 Obesity, unspecified: Secondary | ICD-10-CM

## 2023-09-26 MED ORDER — WEGOVY 0.25 MG/0.5ML ~~LOC~~ SOAJ: 0.2500 mg | SUBCUTANEOUS | 0 refills | Status: DC

## 2023-12-15 ENCOUNTER — Telehealth: Admitting: Family Medicine

## 2023-12-15 ENCOUNTER — Other Ambulatory Visit: Payer: Self-pay | Admitting: Family Medicine

## 2023-12-15 DIAGNOSIS — H10023 Other mucopurulent conjunctivitis, bilateral: Secondary | ICD-10-CM

## 2023-12-15 DIAGNOSIS — J3089 Other allergic rhinitis: Secondary | ICD-10-CM

## 2023-12-15 MED ORDER — POLYMYXIN B-TRIMETHOPRIM 10000-0.1 UNIT/ML-% OP SOLN: 1.0000 [drp] | Freq: Four times a day (QID) | OPHTHALMIC | 0 refills | Status: DC

## 2023-12-15 MED ORDER — FLUTICASONE PROPIONATE 50 MCG/ACT NA SUSP: 2.0000 | Freq: Every day | NASAL | 0 refills | Status: DC

## 2023-12-15 MED ORDER — LEVOCETIRIZINE DIHYDROCHLORIDE 5 MG PO TABS: 5.0000 mg | ORAL_TABLET | Freq: Every evening | ORAL | 0 refills | Status: DC

## 2023-12-15 NOTE — Addendum Note (Signed)
 Addended by: MOISHE CHIQUITA HERO on: 12/15/2023 12:47 PM   Modules accepted: Orders

## 2023-12-15 NOTE — Progress Notes (Signed)
 E visit for Allergic Rhinitis We are sorry that you are not feeling well.  Here is how we plan to help!  Based on what you have shared with me it looks like you have Allergic Rhinitis.  Rhinitis is when a reaction occurs that causes nasal congestion, runny nose, sneezing, and itching.  Most types of rhinitis are caused by an inflammation and are associated with symptoms in the eyes ears or throat. There are several types of rhinitis.  The most common are acute rhinitis, which is usually caused by a viral illness, allergic or seasonal rhinitis, and nonallergic or year-round rhinitis.  Nasal allergies occur certain times of the year.  Allergic rhinitis is caused when allergens in the air trigger the release of histamine in the body.  Histamine causes itching, swelling, and fluid to build up in the fragile linings of the nasal passages, sinuses and eyelids.  An itchy nose and clear discharge are common.  I recommend the following over the counter treatments: You should take a daily dose of antihistamine and Xyzal  5 mg take 1 tablet daily use this in place of zytrec   I also would recommend a nasal spray: Flonase  2 sprays into each nostril once daily and Saline 1 spray into each nostril as needed  You may also benefit from eye drops such as: Systane 1-2 driops each eye twice daily as needed  You can look at allergy eye drops called pataday as well.  HOME CARE:  You can use an over-the-counter saline nasal spray as needed Avoid areas where there is heavy dust, mites, or molds Stay indoors on windy days during the pollen season Keep windows closed in home, at least in bedroom; use air conditioner. Use high-efficiency house air filter Keep windows closed in car, turn AC on re-circulate Avoid playing out with dog during pollen season  GET HELP RIGHT AWAY IF:  If your symptoms do not improve within 10 days You become short of breath You develop yellow or green discharge from your nose for over 3  days You have coughing fits  MAKE SURE YOU:  Understand these instructions Will watch your condition Will get help right away if you are not doing well or get worse  Thank you for choosing an e-visit. Your e-visit answers were reviewed by a board certified advanced clinical practitioner to complete your personal care plan. Depending upon the condition, your plan could have included both over the counter or prescription medications. Please review your pharmacy choice. Be sure that the pharmacy you have chosen is open so that you can pick up your prescription now.  If there is a problem you may message your provider in MyChart to have the prescription routed to another pharmacy. Your safety is important to us . If you have drug allergies check your prescription carefully.  For the next 24 hours, you can use MyChart to ask questions about today's visit, request a non-urgent call back, or ask for a work or school excuse from your e-visit provider. You will get an email in the next two days asking about your experience. I hope that your e-visit has been valuable and will speed your recovery.       I provided 5 minutes of non face-to-face time during this encounter for chart review, medication and order placement, as well as and documentation.

## 2023-12-30 ENCOUNTER — Telehealth: Admitting: Family Medicine

## 2023-12-30 DIAGNOSIS — H5789 Other specified disorders of eye and adnexa: Secondary | ICD-10-CM

## 2023-12-30 NOTE — Progress Notes (Signed)
  Because you were recently treated with an eye drop for pink eye you will need to be seen in person for recurring and on going symptoms, I feel your condition warrants further evaluation and I recommend that you be seen in a face-to-face visit.   NOTE: There will be NO CHARGE for this E-Visit   If you are having a true medical emergency, please call 911.     For an urgent face to face visit, [facility] has multiple urgent care centers for your convenience.  Click the link below for the full list of locations and hours, walk-in wait times, appointment scheduling options and driving directions:  Urgent Care - Home, [place], [place], [place], [place], [place]  [place]     Your MyChart E-visit questionnaire answers were reviewed by a board certified advanced clinical practitioner to complete your personal care plan based on your specific symptoms.    Thank you for using e-Visits.

## 2024-01-14 ENCOUNTER — Other Ambulatory Visit: Payer: Self-pay

## 2024-01-14 ENCOUNTER — Ambulatory Visit: Admitting: Internal Medicine

## 2024-01-14 ENCOUNTER — Encounter: Payer: Self-pay | Admitting: Internal Medicine

## 2024-01-14 VITALS — BP 118/76 | HR 120 | Temp 98.1°F | Resp 16 | Ht 65.0 in | Wt 239.0 lb

## 2024-01-14 DIAGNOSIS — H1013 Acute atopic conjunctivitis, bilateral: Secondary | ICD-10-CM

## 2024-01-14 NOTE — Progress Notes (Signed)
 Established Patient Office Visit  Subjective    Patient ID: MOLLIE ROSSANO, female    DOB: 1985-11-28  Age: 38 y.o. MRN: [id number]  CC:  Chief Complaint  Patient presents with   Eye Problem    Eye redness, watery. Left eye worst then right for months    HPI Orpha P Dadamo presents to discuss red, watery eyes.  Discussed the use of AI scribe software for clinical note transcription with the patient, who gave verbal consent to proceed.  History of Present Illness Angele P Sanjuan is a 38 year old female who presents with chronic eye irritation and watering.  For several months, she experiences constant watering and redness in her eyes, predominantly affecting the left eye. The fluid is clear, with no associated pain. In the mornings, she sometimes perceives a foreign body sensation, requiring focus to clear her vision. Occasionally, she feels a gritty sensation without significant discharge or crusting.  She uses multiple eye drops, including Refresh and Opcon A, and previously used polymyxin eye drops, which she has stopped. She seeks a consistent treatment regimen, as her current use of drops varies.  She does not wear contact lenses. Her symptoms began after returning to school, suspecting environmental factors like allergens. She has never experienced allergies before and is uncertain of their role in her symptoms.  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 Her eyes often appear red at work, prompting concern from colleagues. No significant discharge or vision changes are noted aside from morning blurriness.  Outpatient Encounter Medications as of 01/14/2024  Medication Sig   fluticasone  (FLONASE ) 50 MCG/ACT nasal spray Place 2 sprays into both nostrils daily. (Patient not taking: Reported on 01/14/2024)   levocetirizine (XYZAL ) 5 MG tablet Take 1 tablet (5 mg total) by mouth every evening. (Patient not taking: Reported on  01/14/2024)   Semaglutide -Weight Management (WEGOVY ) 0.25 MG/0.5ML SOAJ Inject 0.25 mg into the skin once a week.   trimethoprim -polymyxin b  (POLYTRIM ) ophthalmic solution Place 1 drop into both eyes in the morning, at noon, in the evening, and at bedtime. (Patient not taking: Reported on 01/14/2024)   No facility-administered encounter medications on file as of 01/14/2024.    No past medical history on file.  No past surgical history on file.  Family History  Problem Relation Age of Onset   Hypertension Mother    Thyroid disease Mother    Parkinson's disease Mother    Hypertension Father    Thyroid disease Father     Social History   Socioeconomic History   Marital status: Single    Spouse name: Not on file   Number of children: 2   Years of education: Not on file   Highest education level: Some college, no degree  Occupational History   Not on file  Tobacco Use   Smoking status: Never    Passive exposure: Never   Smokeless tobacco: Never  Vaping Use   Vaping status: Never Used  Substance and Sexual Activity   Alcohol use: Yes   Drug use: Never   Sexual activity: Yes    Birth control/protection: None  Other Topics Concern   Not on file  Social History Narrative   Not on file   Social Drivers of Health   Financial Resource Strain: Low Risk  (01/10/2024)   Overall Financial Resource Strain (CARDIA)    Difficulty of Paying Living Expenses: Not hard at all  Food Insecurity: No Food Insecurity (  01/10/2024)   Hunger Vital Sign    Worried About Running Out of Food in the Last Year: Never true    Ran Out of Food in the Last Year: Never true  Transportation Needs: No Transportation Needs (01/10/2024)   PRAPARE - Administrator, Civil Service (Medical): No    Lack of Transportation (Non-Medical): No  Physical Activity: Insufficiently Active (01/10/2024)   Exercise Vital Sign    Days of Exercise per Week: 3 days    Minutes of Exercise per Session: 10 min   Stress: No Stress Concern Present (01/10/2024)   [employer] of Occupational Health - Occupational Stress Questionnaire    Feeling of Stress: Not at all  Social Connections: Socially Isolated (01/10/2024)   Social Connection and Isolation Panel    Frequency of Communication with Friends and Family: More than three times a week    Frequency of Social Gatherings with Friends and Family: Twice a week    Attends Religious Services: Never    Database administrator or Organizations: No    Attends Engineer, structural: Not on file    Marital Status: Divorced  Intimate Partner Violence: Not on file    Review of Systems  Constitutional:  Negative for chills and fever.  Eyes:  Positive for discharge and redness. Negative for blurred vision and pain.  Cardiovascular:  Negative for chest pain and palpitations.        Objective    BP 118/76 (Cuff Size: Large)   Pulse (!) 120   Temp 98.1 F (36.7 C) (Oral)   Resp 16   Ht 5' 5 (1.651 m)   Wt 239 lb (108.4 kg)   SpO2 98%   BMI 39.77 kg/m   Physical Exam Constitutional:      Appearance: Normal appearance.  HENT:     Head: Normocephalic and atraumatic.  Eyes:     General: Lids are normal.     Extraocular Movements: Extraocular movements intact.     Conjunctiva/sclera:     Left eye: Left conjunctiva is injected.  Cardiovascular:     Rate and Rhythm: Regular rhythm. Tachycardia present.  Pulmonary:     Effort: Pulmonary effort is normal.     Breath sounds: Normal breath sounds.  Skin:    General: Skin is warm and dry.  Neurological:     General: No focal deficit present.     Mental Status: She is alert. Mental status is at baseline.  Psychiatric:        Mood and Affect: Mood normal.        Behavior: Behavior normal.         Assessment & Plan:   Assessment & Plan  Allergic conjunctivitis and dry eye syndrome Chronic bilateral eye watering and redness, more pronounced in the left eye, with clear  discharge and no pain, indicative of allergic conjunctivitis and dry eye syndrome. Symptoms likely exacerbated by environmental allergens, possibly related to school environment. - Discontinue polymyxin eye drops. - Use Refresh lubricating eye drops, two drops in each eye in the morning and as needed throughout the day. - Use Zaditor antihistamine eye drops, one drop in each eye in the morning and at bedtime. - Consider adding an oral antihistamine (e.g., Xyzal , Claritin, Allegra, Zyrtec) if no relief from eye drops. - If symptoms persist despite treatment, consider referral to an optometrist for further evaluation.  Return for already scheduled.   Sharyle Fischer, DO

## 2024-01-14 NOTE — Patient Instructions (Addendum)
Allergic Conjunctivitis, Adult Allergic conjunctivitis is inflammation of the conjunctiva. The conjunctiva is the thin, clear membrane that covers the white part of the eye and the inner surface of the eyelid. Allergies can affect this layer of the eye. In this condition: The blood vessels in the conjunctiva swell and become irritated. The eyes become red or pink and feel itchy. There is often a watery discharge from the eyes. Allergic conjunctivitis is not contagious. This means it cannot be spread from person to person. The condition can develop at any age and may be outgrown. What are the causes? This condition is caused by allergens. These are things that can cause an allergic reaction in some people. Common allergens include: Outdoor allergens, such as: Pollen, including pollen from grass and weeds. Mold spores. Car fumes. Pollution. Indoor allergens, such as: Dust. Smoke. Mold spores. Proteins in a pet's urine, saliva, or dander. Protein buildup on contact lenses. What increases the risk? You may be more likely to develop this condition if you have a family history of these things: Allergies. Conditions caused by being exposed to allergens, such as: Allergic rhinitis. This is an allergic reaction that affects the nose. Bronchial asthma. This condition affects the large airways in the lungs and makes breathing difficult. Atopic dermatitis (eczema). This is inflammation of the skin that is long-term (chronic). What are the signs or symptoms? Symptoms of this condition include eyes that are itchy, red, watery, or puffy. Your eyes may also: Sting or burn. Have clear fluid draining from them. Have thick mucus discharge and pain (vernal conjunctivitis). This happens in severe cases. How is this diagnosed? This condition may be diagnosed based on: Your medical history. A physical exam, including an eye exam. Tests of the fluid draining from your eyes to rule out other causes. Other  tests to confirm the diagnosis, including: Testing for allergies. The skin may be pricked with a tiny needle. The pricked area is then exposed to small amounts of allergens. Testing for other eye conditions. Tests may include: Blood tests. Tissue scrapings from your eyelid. The tissue is then checked under a microscope. How is this treated? Treatment for this condition may include: Using cold, wet cloths (cold compresses) to soothe itching and swelling. Washing your face and hair. Also, washing your clothes often to remove allergens. Using eye drops. These may be prescription or over-the-counter. You may need to try different types to see which one works best for you. Examples include: Eye drops that wash allergens out of the eyes (preservative-free artificial tears). Eye drops that block the allergic reaction (antihistamine). Eye drops that reduce swelling and irritation (anti-inflammatory). Steroid eye drops, which may be given if other treatments have not worked. Oral antihistamine medicines. These are medicines taken by mouth to lessen your allergic reaction. You may need these if eye drops do not help or are difficult to use. An air purifier at home and work. Wraparound sunglasses. This may help to decrease the amount of allergens reaching the eye. Not wearing contact lenses until symptoms improve, if the condition was caused by contact lenses. Change to daily wear disposable contact lenses, if possible. Follow these instructions at home: Eye care Apply a clean, cold compress to your eyes for 10-20 minutes, 3-4 times a day. Do not touch or rub your eyes. Do not wear contact lenses until the inflammation is gone. Wear glasses instead. Do not wear eye makeup until the inflammation is gone. General instructions Avoid known allergens whenever possible. Take or apply  over-the-counter and prescription medicines only as told by your health care provider. These include any eye drops. Drink  enough fluid to keep your urine pale yellow. Keep all follow-up visits. Contact a health care provider if: Your symptoms get worse or do not get better with treatment. You have mild eye pain. You become sensitive to light. You have spots or blisters on your eyes. You have a fever. Get help right away if: You have redness, swelling, or other symptoms in only one eye. Your vision is blurred or you have other vision changes. You have pus draining from your eyes. You have severe eye pain. Summary Allergic conjunctivitis is inflammation of the eye that is caused by allergens. It affects the clear membrane that covers the white part of the eye and the inner surface of the eyelid. It often causes eye itching, redness, and a watery discharge. Take or apply over-the-counter and prescription medicines only as told by your health care provider. These include eye drops. Do not touch or rub your eyes. Contact a health care provider if your symptoms get worse or do not get better with treatment. This information is not intended to replace advice given to you by your health care provider. Make sure you discuss any questions you have with your health care provider. Document Revised: 07/23/2021 Document Reviewed: 07/23/2021 Elsevier Patient Education  2024 [employer].

## 2024-01-20 ENCOUNTER — Ambulatory Visit: Admitting: Internal Medicine

## 2024-02-28 ENCOUNTER — Encounter: Payer: Self-pay | Admitting: Internal Medicine

## 2024-03-02 ENCOUNTER — Encounter: Payer: Self-pay | Admitting: Internal Medicine

## 2024-03-02 ENCOUNTER — Other Ambulatory Visit (HOSPITAL_COMMUNITY)
Admission: RE | Admit: 2024-03-02 | Discharge: 2024-03-02 | Disposition: A | Source: Ambulatory Visit | Attending: Internal Medicine | Admitting: Internal Medicine

## 2024-03-02 ENCOUNTER — Other Ambulatory Visit: Payer: Self-pay

## 2024-03-02 ENCOUNTER — Ambulatory Visit: Admitting: Internal Medicine

## 2024-03-02 VITALS — BP 124/80 | HR 100 | Temp 98.2°F | Resp 16 | Ht 65.0 in | Wt 237.0 lb

## 2024-03-02 DIAGNOSIS — Z0000 Encounter for general adult medical examination without abnormal findings: Secondary | ICD-10-CM | POA: Insufficient documentation

## 2024-03-02 DIAGNOSIS — Z124 Encounter for screening for malignant neoplasm of cervix: Secondary | ICD-10-CM

## 2024-03-02 DIAGNOSIS — Z1159 Encounter for screening for other viral diseases: Secondary | ICD-10-CM

## 2024-03-02 DIAGNOSIS — Z30011 Encounter for initial prescription of contraceptive pills: Secondary | ICD-10-CM

## 2024-03-02 DIAGNOSIS — Z114 Encounter for screening for human immunodeficiency virus [HIV]: Secondary | ICD-10-CM

## 2024-03-02 DIAGNOSIS — Z1231 Encounter for screening mammogram for malignant neoplasm of breast: Secondary | ICD-10-CM

## 2024-03-02 MED ORDER — NORGESTIMATE-ETH ESTRADIOL 0.18/0.215/0.25 MG-25 MCG PO TABS: 1.0000 | ORAL_TABLET | Freq: Every day | ORAL | 11 refills | Status: AC

## 2024-03-02 NOTE — Progress Notes (Signed)
 Name: Adrienne Lewis   MRN: [id number]    DOB: 10-14-1985   Date:03/02/2024       Progress Note  Subjective  Chief Complaint  Chief Complaint  Patient presents with   Annual Exam    HPI  Patient presents for annual CPE.  Discussed the use of AI scribe software for clinical note transcription with the patient, who gave verbal consent to proceed.  History of Present Illness Adrienne Lewis is a 38 year old female who presents for an annual physical exam.  Her eye symptoms have improved with prescribed eye drops but are not completely resolved. Menstrual periods are generally regular, though her last period was a week late and ended yesterday, which is unusual. She is interested in discussing birth control options, having previously used birth control pills and becoming pregnant with her second child while on them. She is hesitant about an IUD due to procedural concerns. She has no history of smoking or migraines. She is up to date with childhood vaccines and believes she received the HPV vaccine as a teenager. She declines the flu vaccine at this time.   Diet: Regular Exercise: 2 days 20 minutes  Last Eye Exam: completed Last Dental Exam: completed  Flowsheet Row Office Visit from 03/02/2024 in [facility]  AUDIT-C Score 2   Depression: Phq 9 is  negative    03/02/2024   10:25 AM 09/10/2023   10:18 AM  Depression screen PHQ 2/9  Decreased Interest 0 0  Down, Depressed, Hopeless 0 0  PHQ - 2 Score 0 0   Hypertension: BP Readings from Last 3 Encounters:  03/02/24 124/80  01/14/24 118/76  09/10/23 120/70   Obesity: Wt Readings from Last 3 Encounters:  03/02/24 237 lb (107.5 kg)  01/14/24 239 lb (108.4 kg)  09/10/23 182 lb 9.6 oz (82.8 kg)   BMI Readings from Last 3 Encounters:  03/02/24 39.44 kg/m  01/14/24 39.77 kg/m  09/10/23 30.39 kg/m     Vaccines:reviewed with the patient. UTD, declines flu vaccine today.   Hep C Screening:  getting today STD testing and prevention (HIV/chl/gon/syphilis): no concerns  Intimate partner violence: negative screen  Sexual History :active Menstrual History/LMP/Abnormal Bleeding: 02/26/24, no issues. Would like to discuss OCP's today Discussed importance of follow up if any post-menopausal bleeding: not applicable  Incontinence Symptoms: negative for symptoms   Breast cancer:  - Last Mammogram: NA, discussed starting at age 75 will go ahead and order for next year  Osteoporosis Prevention : Discussed high calcium and vitamin D supplementation, weight bearing exercises Bone density :not applicable   Cervical cancer screening: performing today  Skin cancer: Discussed monitoring for atypical lesions  Colorectal cancer: NA, discussed starting at age 69  Lung cancer:  Low Dose CT Chest recommended if Age 53-80 years, 20 pack-year currently smoking OR have quit w/in 15years. Patient does not qualify for screen   ECG: 04/25/2023  Advanced Care Planning: A voluntary discussion about advance care planning including the explanation and discussion of advance directives.  Discussed health care proxy and Living will, and the patient was able to identify a health care proxy.  Patient does not have a living will and power of attorney of health care   There are no active problems to display for this patient.   No past surgical history on file.  Family History  Problem Relation Age of Onset   Hypertension Mother    Thyroid disease Mother    Parkinson's disease  Mother    Hypertension Father    Thyroid disease Father     Social History   Socioeconomic History   Marital status: Single    Spouse name: Not on file   Number of children: 2   Years of education: Not on file   Highest education level: Some college, no degree  Occupational History   Not on file  Tobacco Use   Smoking status: Never    Passive exposure: Never   Smokeless tobacco: Never  Vaping Use   Vaping status: Never  Used  Substance and Sexual Activity   Alcohol use: Yes   Drug use: Never   Sexual activity: Yes    Birth control/protection: None  Other Topics Concern   Not on file  Social History Narrative   Not on file   Social Drivers of Health   Financial Resource Strain: Low Risk  (03/02/2024)   Overall Financial Resource Strain (CARDIA)    Difficulty of Paying Living Expenses: Not very hard  Food Insecurity: No Food Insecurity (03/02/2024)   Hunger Vital Sign    Worried About Running Out of Food in the Last Year: Never true    Ran Out of Food in the Last Year: Never true  Transportation Needs: No Transportation Needs (03/02/2024)   PRAPARE - Administrator, Civil Service (Medical): No    Lack of Transportation (Non-Medical): No  Physical Activity: Insufficiently Active (03/02/2024)   Exercise Vital Sign    Days of Exercise per Week: 2 days    Minutes of Exercise per Session: 20 min  Stress: No Stress Concern Present (03/02/2024)   [employer] of Occupational Health - Occupational Stress Questionnaire    Feeling of Stress: Not at all  Social Connections: Socially Isolated (03/02/2024)   Social Connection and Isolation Panel    Frequency of Communication with Friends and Family: More than three times a week    Frequency of Social Gatherings with Friends and Family: Twice a week    Attends Religious Services: Never    Database administrator or Organizations: No    Attends Banker Meetings: Never    Marital Status: Divorced  Catering manager Violence: Not At Risk (03/02/2024)   Humiliation, Afraid, Rape, and Kick questionnaire    Fear of Current or Ex-Partner: No    Emotionally Abused: No    Physically Abused: No    Sexually Abused: No    No current outpatient medications on file.  Allergies  Allergen Reactions   Erythromycin Other (See Comments)     Review of Systems  All other systems reviewed and are negative.    Objective  Vitals:    03/02/24 1034  BP: 124/80  Pulse: 100  Resp: 16  Temp: 98.2 F (36.8 C)  TempSrc: Oral  SpO2: 98%  Weight: 237 lb (107.5 kg)  Height: 5' 5 (1.651 m)    Body mass index is 39.44 kg/m.  Physical Exam Exam conducted with a chaperone present.  Constitutional:      Appearance: Normal appearance.  HENT:     Head: Normocephalic and atraumatic.  Eyes:     Conjunctiva/sclera: Conjunctivae normal.  Cardiovascular:     Rate and Rhythm: Normal rate and regular rhythm.  Pulmonary:     Effort: Pulmonary effort is normal.     Breath sounds: Normal breath sounds.  Chest:  Breasts:    Right: Normal.     Left: Normal.  Genitourinary:    Comments: External genitalia within  normal limits.  Vaginal mucosa pink, moist, normal rugae.  Nonfriable cervix without lesions, mild bleeding noted on speculum exam.  Bimanual exam revealed normal, nongravid uterus.  No cervical motion tenderness. No adnexal masses bilaterally.    Lymphadenopathy:     Upper Body:     Right upper body: No supraclavicular, axillary or pectoral adenopathy.     Left upper body: No supraclavicular, axillary or pectoral adenopathy.  Skin:    General: Skin is warm and dry.  Neurological:     General: No focal deficit present.     Mental Status: She is alert. Mental status is at baseline.  Psychiatric:        Mood and Affect: Mood normal.        Behavior: Behavior normal.     Last CBC Lab Results  Component Value Date   WBC 9.2 04/23/2023   HGB 13.6 04/23/2023   HCT 41.4 04/23/2023   MCV 91.6 04/23/2023   MCH 30.1 04/23/2023   RDW 11.9 04/23/2023   PLT 388 04/23/2023   Last metabolic panel Lab Results  Component Value Date   GLUCOSE 103 (H) 04/23/2023   NA 137 04/23/2023   K 3.6 04/23/2023   CL 105 04/23/2023   CO2 22 04/23/2023   BUN 12 04/23/2023   CREATININE 0.84 04/23/2023   GFRNONAA >60 04/23/2023   CALCIUM 9.0 04/23/2023   PROT 8.4 (H) 04/10/2023   ALBUMIN 4.2 04/10/2023   BILITOT 0.7  04/10/2023   ALKPHOS 69 04/10/2023   AST 20 04/10/2023   ALT 19 04/10/2023   ANIONGAP 10 04/23/2023   Last lipids Lab Results  Component Value Date   CHOL 133 09/12/2023   HDL 45 (L) 09/12/2023   LDLCALC 76 09/12/2023   TRIG 42 09/12/2023   CHOLHDL 3.0 09/12/2023   Last hemoglobin A1c Lab Results  Component Value Date   HGBA1C 5.6 09/12/2023   Last thyroid functions Lab Results  Component Value Date   TSH 1.98 09/12/2023   Last vitamin D No results found for: 25OHVITD2, 25OHVITD3, VD25OH Last vitamin B12 and Folate No results found for: VITAMINB12, FOLATE    Assessment & Plan  Assessment & Plan Adult Wellness Visit Routine visit with well-controlled blood pressure. - Perform physical examination. - Order basic screening labs for kidneys, liver, and electrolytes. - Confirm HPV vaccination status. - Discussed flu vaccine; she declined.  Cervical Cancer Screening Cervical cancer screening is due. - Perform Pap smear.  Contraceptive Management Prefers oral contraceptives with no contraindications. Discussed contraceptive options and side effects, including risk of blood clots with estrogen. Emphasized follow-up for blood pressure monitoring and side effect assessment. - Prescribe Tri-Lo Sprintect birth control pills. - Schedule follow-up in one month to recheck blood pressure and assess for any side effects.  General Health Maintenance Up to date with childhood vaccinations. Likely received HPV vaccine. Discussed mammograms and colon cancer screening timelines. - Order mammogram after 40th birthday. - Plan colon cancer screening at age 41.  - CBC w/Diff/Platelet - Comprehensive Metabolic Panel (CMET) - Hepatitis C Antibody - HIV antibody (with reflex) - Cytology - PAP - MM 3D SCREENING MAMMOGRAM BILATERAL BREAST; Future - Norgestimate-Eth Estradiol (TRI-LO-SPRINTEC) 0.18/0.215/0.25 MG-25 MCG TABS; Take 1 each by mouth daily.  Dispense: 28 tablet;  Refill: 11   -USPSTF grade A and B recommendations reviewed with patient; age-appropriate recommendations, preventive care, screening tests, etc discussed and encouraged; healthy living encouraged; see AVS for patient education given to patient -Discussed importance of 150 minutes of  physical activity weekly, eat two servings of fish weekly, eat one serving of tree nuts ( cashews, pistachios, pecans, almonds.SABRA) every other day, eat 6 servings of fruit/vegetables daily and drink plenty of water and avoid sweet beverages.   -Reviewed Health Maintenance: Yes.

## 2024-03-03 LAB — COMPREHENSIVE METABOLIC PANEL WITH GFR
AG Ratio: 1.6 (calc) (ref 1.0–2.5)
ALT: 17 U/L (ref 6–29)
AST: 16 U/L (ref 10–30)
Albumin: 4.4 g/dL (ref 3.6–5.1)
Alkaline phosphatase (APISO): 68 U/L (ref 31–125)
BUN: 12 mg/dL (ref 7–25)
CO2: 25 mmol/L (ref 20–32)
Calcium: 9.4 mg/dL (ref 8.6–10.2)
Chloride: 107 mmol/L (ref 98–110)
Creat: 0.8 mg/dL (ref 0.50–0.97)
Globulin: 2.8 g/dL (ref 1.9–3.7)
Glucose, Bld: 102 mg/dL — ABNORMAL HIGH (ref 65–99)
Potassium: 3.8 mmol/L (ref 3.5–5.3)
Sodium: 140 mmol/L (ref 135–146)
Total Bilirubin: 0.3 mg/dL (ref 0.2–1.2)
Total Protein: 7.2 g/dL (ref 6.1–8.1)
eGFR: 96 mL/min/1.73m2 (ref >=60)

## 2024-03-03 LAB — CBC WITH DIFFERENTIAL/PLATELET
Absolute Lymphocytes: 1562 {cells}/uL (ref 850–3900)
Absolute Monocytes: 410 {cells}/uL (ref 200–950)
Basophils Absolute: 38 {cells}/uL (ref 0–200)
Basophils Relative: 0.6 %
Eosinophils Absolute: 88 {cells}/uL (ref 15–500)
Eosinophils Relative: 1.4 %
HCT: 43.1 % (ref 35.0–45.0)
Hemoglobin: 14.2 g/dL (ref 11.7–15.5)
MCH: 30.3 pg (ref 27.0–33.0)
MCHC: 32.9 g/dL (ref 32.0–36.0)
MCV: 91.9 fL (ref 80.0–100.0)
MPV: 10.5 fL (ref 7.5–12.5)
Monocytes Relative: 6.5 %
Neutro Abs: 4202 {cells}/uL (ref 1500–7800)
Neutrophils Relative %: 66.7 %
Platelets: 341 Thousand/uL (ref 140–400)
RBC: 4.69 Million/uL (ref 3.80–5.10)
RDW: 12.2 % (ref 11.0–15.0)
Total Lymphocyte: 24.8 %
WBC: 6.3 Thousand/uL (ref 3.8–10.8)

## 2024-03-03 LAB — HIV ANTIBODY (ROUTINE TESTING W REFLEX)
HIV 1&2 Ab, 4th Generation: NONREACTIVE
HIV FINAL INTERPRETATION: NEGATIVE

## 2024-03-03 LAB — CYTOLOGY - PAP
Comment: NEGATIVE
Diagnosis: NEGATIVE
High risk HPV: POSITIVE — AB

## 2024-03-03 LAB — HEPATITIS C ANTIBODY: Hepatitis C Ab: NONREACTIVE

## 2024-03-04 ENCOUNTER — Ambulatory Visit: Payer: Self-pay | Admitting: Internal Medicine

## 2024-03-30 ENCOUNTER — Ambulatory Visit: Admitting: Internal Medicine

## 2024-04-06 ENCOUNTER — Encounter: Payer: Self-pay | Admitting: Internal Medicine

## 2024-04-06 ENCOUNTER — Other Ambulatory Visit: Payer: Self-pay

## 2024-04-06 ENCOUNTER — Ambulatory Visit: Admitting: Internal Medicine

## 2024-04-06 VITALS — BP 122/78 | Temp 98.0°F | Resp 16 | Ht 65.0 in | Wt 239.0 lb

## 2024-04-06 DIAGNOSIS — Z3041 Encounter for surveillance of contraceptive pills: Secondary | ICD-10-CM | POA: Diagnosis not present

## 2024-04-06 DIAGNOSIS — Z23 Encounter for immunization: Secondary | ICD-10-CM | POA: Diagnosis not present

## 2024-04-06 NOTE — Progress Notes (Signed)
 Established Patient Office Visit  Subjective   Patient ID: Adrienne Lewis, female    DOB: 1986-01-22  Age: 38 y.o. MRN: [id number]  Chief Complaint  Patient presents with   Contraception    4 week recheck    HPI  Patient is here to recheck birth control.   Discussed the use of AI scribe software for clinical note transcription with the patient, who gave verbal consent to proceed.  History of Present Illness Adrienne Lewis is a 38 year old female who presents for a follow-up on her birth control regimen.  She started a new birth control pill one month ago and experiences intermittent nausea, particularly when taken without food. Taking the pill at night after dinner alleviates the nausea. She completed one cycle and is on the second pack, with a normal menstrual period during the placebo week. She has no issues with adherence to the pill regimen.  She is unsure about her HPV vaccination status, as she does not have a record of receiving it as a teenager.  She is interested in weight management and is considering an oral form of Wegovy  for weight loss before her 89th birthday in May.   There are no active problems to display for this patient.  No past medical history on file. No past surgical history on file. Social History   Tobacco Use   Smoking status: Never    Passive exposure: Never   Smokeless tobacco: Never  Vaping Use   Vaping status: Never Used  Substance Use Topics   Alcohol use: Yes   Drug use: Never   Social History   Socioeconomic History   Marital status: Single    Spouse name: Not on file   Number of children: 2   Years of education: Not on file   Highest education level: Some college, no degree  Occupational History   Not on file  Tobacco Use   Smoking status: Never    Passive exposure: Never   Smokeless tobacco: Never  Vaping Use   Vaping status: Never Used  Substance and Sexual Activity   Alcohol use: Yes   Drug use: Never   Sexual  activity: Yes    Birth control/protection: None  Other Topics Concern   Not on file  Social History Narrative   Not on file   Social Drivers of Health   Financial Resource Strain: Low Risk  (04/02/2024)   Overall Financial Resource Strain (CARDIA)    Difficulty of Paying Living Expenses: Not very hard  Food Insecurity: No Food Insecurity (04/02/2024)   Hunger Vital Sign    Worried About Running Out of Food in the Last Year: Never true    Ran Out of Food in the Last Year: Never true  Transportation Needs: No Transportation Needs (04/02/2024)   PRAPARE - Administrator, Civil Service (Medical): No    Lack of Transportation (Non-Medical): No  Physical Activity: Insufficiently Active (04/02/2024)   Exercise Vital Sign    Days of Exercise per Week: 5 days    Minutes of Exercise per Session: 20 min  Stress: No Stress Concern Present (04/02/2024)   [employer] of Occupational Health - Occupational Stress Questionnaire    Feeling of Stress: Not at all  Social Connections: Moderately Isolated (04/02/2024)   Social Connection and Isolation Panel    Frequency of Communication with Friends and Family: More than three times a week    Frequency of Social Gatherings with Friends and Family: More  than three times a week    Attends Religious Services: 1 to 4 times per year    Active Member of Clubs or Organizations: No    Attends Banker Meetings: Not on file    Marital Status: Divorced  Intimate Partner Violence: Not At Risk (03/02/2024)   Humiliation, Afraid, Rape, and Kick questionnaire    Fear of Current or Ex-Partner: No    Emotionally Abused: No    Physically Abused: No    Sexually Abused: No   Family Status  Relation Name Status   Mother  (Not Specified)   Father  (Not Specified)  No partnership data on file   Family History  Problem Relation Age of Onset   Hypertension Mother    Thyroid disease Mother    Parkinson's disease Mother    Hypertension  Father    Thyroid disease Father    Allergies  Allergen Reactions   Erythromycin Other (See Comments)      Review of Systems  Gastrointestinal:  Positive for nausea.  All other systems reviewed and are negative.     Objective:     BP 122/78 (Cuff Size: Large)   Temp 98 F (36.7 C) (Oral)   Resp 16   Ht 5' 5 (1.651 m)   Wt 239 lb (108.4 kg)   SpO2 96%   BMI 39.77 kg/m  BP Readings from Last 3 Encounters:  04/06/24 122/78  03/02/24 124/80  01/14/24 118/76   Wt Readings from Last 3 Encounters:  04/06/24 239 lb (108.4 kg)  03/02/24 237 lb (107.5 kg)  01/14/24 239 lb (108.4 kg)      Physical Exam Constitutional:      Appearance: Normal appearance.  HENT:     Head: Normocephalic and atraumatic.  Cardiovascular:     Rate and Rhythm: Normal rate and regular rhythm.  Pulmonary:     Effort: Pulmonary effort is normal.     Breath sounds: Normal breath sounds.  Skin:    General: Skin is warm and dry.  Neurological:     General: No focal deficit present.     Mental Status: She is alert. Mental status is at baseline.  Psychiatric:        Mood and Affect: Mood normal.        Behavior: Behavior normal.      No results found for any visits on 04/06/24.  Last CBC Lab Results  Component Value Date   WBC 6.3 03/02/2024   HGB 14.2 03/02/2024   HCT 43.1 03/02/2024   MCV 91.9 03/02/2024   MCH 30.3 03/02/2024   RDW 12.2 03/02/2024   PLT 341 03/02/2024   Last metabolic panel Lab Results  Component Value Date   GLUCOSE 102 (H) 03/02/2024   NA 140 03/02/2024   K 3.8 03/02/2024   CL 107 03/02/2024   CO2 25 03/02/2024   BUN 12 03/02/2024   CREATININE 0.80 03/02/2024   EGFR 96 03/02/2024   CALCIUM 9.4 03/02/2024   PROT 7.2 03/02/2024   ALBUMIN 4.2 04/10/2023   BILITOT 0.3 03/02/2024   ALKPHOS 69 04/10/2023   AST 16 03/02/2024   ALT 17 03/02/2024   ANIONGAP 10 04/23/2023   Last lipids Lab Results  Component Value Date   CHOL 133 09/12/2023   HDL 45  (L) 09/12/2023   LDLCALC 76 09/12/2023   TRIG 42 09/12/2023   CHOLHDL 3.0 09/12/2023   Last hemoglobin A1c Lab Results  Component Value Date   HGBA1C 5.6 09/12/2023  Last thyroid functions Lab Results  Component Value Date   TSH 1.98 09/12/2023   Last vitamin D No results found for: 25OHVITD2, 25OHVITD3, VD25OH Last vitamin B12 and Folate No results found for: VITAMINB12, FOLATE    The ASCVD Risk score (Arnett DK, et al., 2019) failed to calculate for the following reasons:   The 2019 ASCVD risk score is only valid for ages 70 to 22    Assessment & Plan:   Assessment & Plan Contraceptive management (oral hormonal) Mild nausea likely due to estrogen in oral contraceptive. Blood pressure controlled. No adherence or menstrual issues. - Continue current oral contraceptive regimen. - Advised taking contraceptive at night to minimize nausea. - Monitor symptoms and blood pressure.  Immunization administration (HPV vaccine) Uncertain HPV vaccination status. Eligible for vaccination until age 54. Agreed to receive HPV vaccine series. - Administered first dose of HPV vaccine. - Schedule nurse visit in 30-60 days for second dose. - Plan third dose six months from first dose.  - HPV 9-valent vaccine,Recombinat   Return in about 11 months (around 03/06/2025) for physical; second HPV vaccine in 30-60 days nurse's visit.    Sharyle Fischer, DO

## 2024-05-18 ENCOUNTER — Ambulatory Visit

## 2024-05-18 DIAGNOSIS — Z23 Encounter for immunization: Secondary | ICD-10-CM

## 2024-05-26 ENCOUNTER — Encounter: Payer: Self-pay | Admitting: Internal Medicine

## 2024-05-26 NOTE — Telephone Encounter (Signed)
 Please schedule patient appointment or she may go to [facility]

## 2024-05-27 ENCOUNTER — Telehealth: Admitting: Physician Assistant

## 2024-05-27 DIAGNOSIS — H9202 Otalgia, left ear: Secondary | ICD-10-CM | POA: Diagnosis not present

## 2024-05-27 MED ORDER — AMOXICILLIN-POT CLAVULANATE 875-125 MG PO TABS: 1.0000 | ORAL_TABLET | Freq: Two times a day (BID) | ORAL | 0 refills | Status: AC

## 2024-05-27 NOTE — Progress Notes (Signed)
 "  E-Visit for Ear Pain - Acute Otitis Media   We are sorry that you are not feeling well. Here is how we plan to help!  Based on what you have shared with me it looks like you have Acute Otitis Media.  Acute Otitis Media is an infection of the middle or inner ear. This type of infection can cause redness, inflammation, and fluid buildup behind the tympanic membrane (ear drum).  The usual symptoms include: Earache/Pain Fever Upper respiratory symptoms Lack of energy/Fatigue/Malaise Slight hearing loss gradually worsening- if the inner ear fills with fluid What causes middle ear infections? Most middle ear infections occur when an infection such as a cold, leads to a build-up of mucus in the middle ear and causes the Eustachian tube (a thin tube that runs from the middle ear to the back of the nose) to become swollen or blocked.   This means mucus can't drain away properly, making it easier for an infection to spread into the middle ear.  How middle ear infections are treated: Most ear infections clear up within three to five days and don't need any specific treatment. If necessary, tylenol  or ibuprofen should be used to relieve pain and a high temperature.  If you develop a fever higher than 102, or any significantly worsening symptoms, this could indicate a more serious infection moving to the middle/inner and needs face to face evaluation in an office by a provider.   Antibiotics aren't routinely used to treat middle ear infections, although they may occasionally be prescribed if symptoms persist or are particularly severe. Given your presentation,   I have prescribed Augmentin 875-125 mg one tablet by mouth twice a day for 10 days   Your symptoms should improve over the next 3 days and should resolve in about 7 days. Be sure to complete ALL of the prescription(s) given.  HOME CARE: Wash your hands frequently. If you are prescribed an ear drop, do not place the tip of the bottle on your  ear or touch it with your fingers. You can take Acetaminophen  650 mg every 4-6 hours as needed for pain.  If pain is severe or moderate, you can apply a heating pad (set on low) or hot water bottle (wrapped in a towel) to outer ear for 20 minutes.  This will also increase drainage.  GET HELP RIGHT AWAY IF: Fever is over 102.2 degrees. You develop progressive ear pain or hearing loss. Ear symptoms persist longer than 3 days after treatment.  MAKE SURE YOU: Understand these instructions. Will watch your condition. Will get help right away if you are not doing well or get worse.  Thank you for choosing an e-visit.  Your e-visit answers were reviewed by a board certified advanced clinical practitioner to complete your personal care plan. Depending upon the condition, your plan could have included both over the counter or prescription medications.  Please review your pharmacy choice. Make sure the pharmacy is open so you can pick up the prescription now. If there is a problem, you may contact your provider through [employer] and have the prescription routed to another pharmacy.  Your safety is important to us . If you have drug allergies check your prescription carefully.   For the next 24 hours you can use MyChart to ask questions about today's visit, request a non-urgent call back, or ask for a work or school excuse. You will get an email with a survey after your eVisit asking about your experience. We would  appreciate your feedback. I hope that your e-visit has been valuable and will aid in your recovery.  I have spent 5 minutes in review of e-visit questionnaire, review and updating patient chart, medical decision making and response to patient.   Elsie Velma Lunger, PA-C      "

## 2024-06-02 ENCOUNTER — Encounter: Payer: Self-pay | Admitting: Internal Medicine

## 2024-06-08 ENCOUNTER — Other Ambulatory Visit: Payer: Self-pay | Admitting: Internal Medicine

## 2024-06-08 DIAGNOSIS — E669 Obesity, unspecified: Secondary | ICD-10-CM

## 2024-06-08 MED ORDER — SEMAGLUTIDE-WEIGHT MANAGEMENT 1.5 MG PO TABS: 1.5000 mg | ORAL_TABLET | Freq: Every day | ORAL | 0 refills | Status: AC

## 2025-03-04 ENCOUNTER — Encounter: Admitting: Internal Medicine
